# Patient Record
Sex: Male | Born: 1995 | Race: Black or African American | Hispanic: No | Marital: Single | Smoking: Current some day smoker
Health system: Southern US, Community
[De-identification: ages and names within clinical notes are randomized; demographics above are authoritative.]

## PROBLEM LIST (undated history)

## (undated) DIAGNOSIS — J302 Other seasonal allergic rhinitis: Secondary | ICD-10-CM

---

## 2013-03-20 ENCOUNTER — Encounter (HOSPITAL_COMMUNITY): Payer: Self-pay | Admitting: Nurse Practitioner

## 2013-03-20 ENCOUNTER — Emergency Department (HOSPITAL_COMMUNITY)
Admission: EM | Admit: 2013-03-20 | Discharge: 2013-03-20 | Disposition: A | Discharge status: Home / Self Care | Payer: Medicaid Other | Attending: Emergency Medicine | Admitting: Emergency Medicine

## 2013-03-20 DIAGNOSIS — F17200 Nicotine dependence, unspecified, uncomplicated: Secondary | ICD-10-CM | POA: Insufficient documentation

## 2013-03-20 DIAGNOSIS — N63 Unspecified lump in unspecified breast: Secondary | ICD-10-CM | POA: Insufficient documentation

## 2013-03-20 DIAGNOSIS — N644 Mastodynia: Secondary | ICD-10-CM

## 2013-03-20 NOTE — ED Provider Notes (Signed)
History     CSN: [id number]  Arrival date & time 03/20/13  1346   First MD Initiated Contact with Patient 03/20/13 1359      Chief Complaint  Patient presents with  . Breast Problem    (Consider location/radiation/quality/duration/timing/severity/associated sxs/prior treatment) HPI  Pt is a 17 yo M presenting for "knot" next to his left nipple that he first noted two to three weeks ago. Pain is sharp with palpation, achy at other times. Denies any trauma or injury to area. Denies any rashes, fevers, chills, drainage, warmth in the area. Patient denies fevers, chills, CP, SOB, nausea, vomiting, or diarrhea.    History reviewed. No pertinent past medical history.  History reviewed. No pertinent past surgical history.  History reviewed. No pertinent family history.  History  Substance Use Topics  . Smoking status: Current Some Day Smoker    Types: Cigarettes  . Smokeless tobacco: Not on file  . Alcohol Use: No      Review of Systems  Constitutional: Negative for fever and chills.  HENT: Negative for neck pain.   Eyes: Negative for visual disturbance.  Respiratory: Negative for shortness of breath.   Cardiovascular: Negative for chest pain.  Musculoskeletal: Negative for back pain.  Skin: Negative for color change, pallor, rash and wound.  Neurological: Negative for headaches.    Allergies  Review of patient's allergies indicates no known allergies.  Home Medications  No current outpatient prescriptions on file.  BP 130/68  Pulse 63  Temp(Src) 98.2 F (36.8 C) (Oral)  Resp 16  SpO2 99%  Physical Exam  Constitutional: He is oriented to person, place, and time. He appears well-developed and well-nourished. No distress.  HENT:  Head: Normocephalic and atraumatic.  Eyes: Conjunctivae are normal.  Neck: Neck supple.  Cardiovascular: Normal rate, regular rhythm and normal heart sounds.   Pulmonary/Chest: Effort normal and breath sounds normal.    Small  moveable cyst   Neurological: He is alert and oriented to person, place, and time.  Skin: Skin is warm and dry. He is not diaphoretic.  Psychiatric: He has a normal mood and affect.    ED Course  Procedures (including critical care time)  Labs Reviewed - No data to display No results found.   1. Breast pain in male       MDM  Pt is a 17 yo M presenting for left small freely moving cyst near his nipple. VSS. No erythema, warmth, or drainage noted. No signs of infection. Pt advised to call a PCP for follow up and management. Advised to use warm compresses and symptomatic care. Return precautions given. Patient agreeable to plan. Patient is stable at time of discharge         Jeannetta Ellis, PA-C 03/20/13 1522

## 2013-03-20 NOTE — ED Notes (Signed)
States it feels like theres a knot in his L nipple today, painful and swollen. States he had something similar as a child that resolved with no treatment.

## 2013-03-21 NOTE — ED Provider Notes (Signed)
Medical screening examination/treatment/procedure(s) were performed by non-physician practitioner and as supervising physician I was immediately available for consultation/collaboration.   Arlie Riker III, MD 03/21/13 1815 

## 2013-05-27 ENCOUNTER — Ambulatory Visit: Payer: Self-pay | Admitting: Pediatrics

## 2013-07-08 ENCOUNTER — Ambulatory Visit: Payer: Self-pay | Admitting: Pediatrics

## 2013-08-03 ENCOUNTER — Ambulatory Visit: Payer: Self-pay | Admitting: Pediatrics

## 2014-09-22 ENCOUNTER — Encounter (HOSPITAL_COMMUNITY): Payer: Self-pay | Admitting: *Deleted

## 2014-09-22 ENCOUNTER — Emergency Department (HOSPITAL_COMMUNITY)
Admission: EM | Admit: 2014-09-22 | Discharge: 2014-09-22 | Disposition: A | Discharge status: Home / Self Care | Payer: Medicaid Other | Attending: Emergency Medicine | Admitting: Emergency Medicine

## 2014-09-22 DIAGNOSIS — X58XXXA Exposure to other specified factors, initial encounter: Secondary | ICD-10-CM | POA: Insufficient documentation

## 2014-09-22 DIAGNOSIS — Y9289 Other specified places as the place of occurrence of the external cause: Secondary | ICD-10-CM | POA: Diagnosis not present

## 2014-09-22 DIAGNOSIS — Y998 Other external cause status: Secondary | ICD-10-CM | POA: Diagnosis not present

## 2014-09-22 DIAGNOSIS — Y9389 Activity, other specified: Secondary | ICD-10-CM | POA: Diagnosis not present

## 2014-09-22 DIAGNOSIS — N63 Unspecified lump in unspecified breast: Secondary | ICD-10-CM

## 2014-09-22 DIAGNOSIS — S6392XA Sprain of unspecified part of left wrist and hand, initial encounter: Secondary | ICD-10-CM | POA: Insufficient documentation

## 2014-09-22 DIAGNOSIS — N6002 Solitary cyst of left breast: Secondary | ICD-10-CM | POA: Diagnosis present

## 2014-09-22 DIAGNOSIS — Z720 Tobacco use: Secondary | ICD-10-CM | POA: Insufficient documentation

## 2014-09-22 NOTE — Discharge Instructions (Signed)
Please read and follow all provided instructions.  Your diagnoses today include:  1. Sprain of hand, left, initial encounter   2. Breast lump     Tests performed today include:  Vital signs. See below for your results today.   Medications prescribed:   None  Take any prescribed medications only as directed.  Home care instructions:   Follow any educational materials contained in this packet  Monitor the area on your chest and seek medical attention if you feel a lump that is becoming larger  Use ibuprofen as directed on the packaging for pain  Follow R.I.C.E. Protocol:  R - rest your injury   I  - use ice on injury without applying directly to skin  C - compress injury with bandage or splint  E - elevate the injury as much as possible  Follow-up instructions: Please follow-up with your primary care provider if you continue to have significant pain or trouble walking in 1 week. In this case you may have a severe injury that requires further care.   Return instructions:   Please return if your toes are numb or tingling, appear gray or blue, or you have severe pain (also elevate leg and loosen splint or wrap if you were given one)  Please return to the Emergency Department if you experience worsening symptoms.   Please return if you have any other emergent concerns.  Additional Information:  Your vital signs today were: BP 134/77 mmHg   Pulse 84   Temp(Src) 97.8 F (36.6 C) (Oral)   Resp 18   SpO2 100% If your blood pressure (BP) was elevated above 135/85 this visit, please have this repeated by your doctor within one month. --------------

## 2014-09-22 NOTE — ED Notes (Signed)
The pt is here to get the cyst he has had in his lt chest checked.  He has had for 2 months.  He wants to be checked to see if it is still there
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
## 2014-10-11 ENCOUNTER — Emergency Department (HOSPITAL_COMMUNITY)
Admission: EM | Admit: 2014-10-11 | Discharge: 2014-10-11 | Disposition: A | Discharge status: Home / Self Care | Payer: Medicaid Other | Attending: Emergency Medicine | Admitting: Emergency Medicine

## 2014-10-11 ENCOUNTER — Encounter (HOSPITAL_COMMUNITY): Payer: Self-pay | Admitting: Family Medicine

## 2014-10-11 DIAGNOSIS — Z720 Tobacco use: Secondary | ICD-10-CM | POA: Insufficient documentation

## 2014-10-11 DIAGNOSIS — R42 Dizziness and giddiness: Secondary | ICD-10-CM | POA: Diagnosis not present

## 2014-10-11 LAB — BASIC METABOLIC PANEL
Anion gap: 11 (ref 5–15)
BUN: 12 mg/dL (ref 6–23)
CALCIUM: 9.3 mg/dL (ref 8.4–10.5)
CO2: 28 mEq/L (ref 19–32)
Chloride: 102 mEq/L (ref 96–112)
Creatinine, Ser: 0.99 mg/dL (ref 0.50–1.35)
GFR calc Af Amer: >90 mL/min (ref >90)
GFR calc non Af Amer: >90 mL/min (ref >90)
GLUCOSE: 69 mg/dL — AB (ref 70–99)
Potassium: 4.3 mEq/L (ref 3.7–5.3)
Sodium: 141 mEq/L (ref 137–147)

## 2014-10-11 LAB — CBC
HEMATOCRIT: 44.7 % (ref 39.0–52.0)
HEMOGLOBIN: 15.3 g/dL (ref 13.0–17.0)
MCH: 32 pg (ref 26.0–34.0)
MCHC: 34.2 g/dL (ref 30.0–36.0)
MCV: 93.5 fL (ref 78.0–100.0)
Platelets: 165 10*3/uL (ref 150–400)
RBC: 4.78 MIL/uL (ref 4.22–5.81)
RDW: 12.4 % (ref 11.5–15.5)
WBC: 4.5 10*3/uL (ref 4.0–10.5)

## 2014-10-11 NOTE — ED Notes (Signed)
Per pt sts that he was sitting in [employer] and became [place]zy. [facility]

## 2014-10-11 NOTE — Discharge Instructions (Signed)

## 2014-10-11 NOTE — ED Notes (Signed)
Pt made aware to return if symptoms worsen or if any life threatening symptoms occur.   

## 2014-10-11 NOTE — ED Notes (Signed)
Pt reports dizziness and lighthead today with no vision changes.  Pt denies h/a, n/v/d.  Pt thought he was going to pass out.  Pt is currently feeling less dizzy.

## 2014-10-11 NOTE — ED Provider Notes (Signed)
CSN: [id number]     Arrival date & time 10/11/14  1158 History   First MD Initiated Contact with Patient 10/11/14 1200     Chief Complaint  Patient presents with  . Dizziness     (Consider location/radiation/quality/duration/timing/severity/associated sxs/prior Treatment) HPI Comments: Patient presents today with a chief complaint of dizziness.  He reports that around 11 AM today he began feeling dizzy while sitting at [employer].  He reports that the dizziness lasted a few minutes and then resolved.  He describes the dizziness as feeling lightheaded.  He denies any dizziness at this time.  He states that he had not eaten or drank anything at all today prior to the episode.  When the dizziness occurred he ate and then the dizziness resolved.  No history of DM.  He denies fever, chills, SOB, headache, chest pain, syncope, nausea, vomiting, diarrhea, or vision changes.  Patient is a 18 y.o. male presenting with dizziness. The history is provided by the patient.  Dizziness   History reviewed. No pertinent past medical history. History reviewed. No pertinent past surgical history. History reviewed. No pertinent family history. History  Substance Use Topics  . Smoking status: Current Some Day Smoker    Types: Cigarettes  . Smokeless tobacco: Not on file  . Alcohol Use: No    Review of Systems  Neurological: Positive for dizziness.  All other systems reviewed and are negative.     Allergies  Review of patient's allergies indicates no known allergies.  Home Medications   Prior to Admission medications   Not on File   BP 126/74 mmHg  Pulse 103  Temp(Src) 98 F (36.7 C) (Oral)  Resp 16  Ht 5\' 11"  (1.803 m)  Wt 140 lb (63.504 kg)  BMI 19.53 kg/m2  SpO2 97% Physical Exam  Constitutional: He appears well-developed and well-nourished.  HENT:  Head: Normocephalic and atraumatic.  Mouth/Throat: Oropharynx is clear and moist.  Eyes: EOM are normal. Pupils are equal, round,  and reactive to light.  Neck: Normal range of motion. Neck supple.  Cardiovascular: Normal rate, regular rhythm and normal heart sounds.   No murmur heard. Pulmonary/Chest: Effort normal and breath sounds normal.  Musculoskeletal: Normal range of motion.  Neurological: He is alert. He has normal strength. No cranial nerve deficit or sensory deficit. Coordination and gait normal.  Normal rapid alternating movements Normal finger to nose testing Normal gait, no ataxia  Skin: Skin is warm and dry.  Psychiatric: He has a normal mood and affect.  Nursing note and vitals reviewed.   ED Course  Procedures (including critical care time) Labs Review Labs Reviewed  CBC  BASIC METABOLIC PANEL    Imaging Review No results found.   EKG Interpretation   Date/Time:  Wednesday October 11 2014 15:30:37 EST Ventricular Rate:  66 PR Interval:  116 QRS Duration: 87 QT Interval:  385 QTC Calculation: 403 R Axis:   92 Text Interpretation:  Sinus rhythm Atrial premature complex Borderline  short PR interval Borderline right axis deviation Borderline Q waves in  lateral leads Confirmed by Lincoln Brighamees, Liz [phone]([id number]) on 10/11/2014 4:00:56 PM     4:14 PM Patient's blood sugar is 69.  Patient given sandwich and something to drink. 4:40 PM Reassessed patient.  Patient eating [employer] Crackers, Peanut Butter, and a Sandwich.  He has also drank a 12 oz Soda.  No dizziness at this time.  Feel that the patient is stable for discharge. MDM   Final diagnoses:  None  Patient presents with a chief complaint of dizziness.  He reports that this occurred earlier in the day today and resolved after a few minutes once he ate.  No dizziness while in the ED.  Normal neurological exam.  VSS.  Labs unremarkable.  Feel that the patient is stable for discharge.  Return precautions given.     Santiago GladHeather Sade Hollon, PA-C 10/13/14 [id number]  Tilden FossaElizabeth Rees, MD 10/16/14 [phone]

## 2014-10-16 ENCOUNTER — Encounter (HOSPITAL_COMMUNITY): Payer: Self-pay | Admitting: Family Medicine

## 2014-10-16 ENCOUNTER — Emergency Department (HOSPITAL_COMMUNITY)
Admission: EM | Admit: 2014-10-16 | Discharge: 2014-10-16 | Discharge status: Against Medical Advice | Payer: Medicaid Other | Attending: Emergency Medicine | Admitting: Emergency Medicine

## 2014-10-16 DIAGNOSIS — Z0000 Encounter for general adult medical examination without abnormal findings: Secondary | ICD-10-CM | POA: Insufficient documentation

## 2014-10-16 DIAGNOSIS — Z720 Tobacco use: Secondary | ICD-10-CM | POA: Insufficient documentation

## 2014-10-16 NOTE — ED Notes (Signed)
Pt states he "felt dizzy for a little while" this am. Denies N/V, denies pain. No dizziness at present.

## 2014-10-16 NOTE — ED Notes (Signed)
Pt asking for bus pass.

## 2014-10-16 NOTE — ED Notes (Signed)
Pt sts he is here for BP check. Denies any other complaints, symptoms or problems.

## 2014-10-17 ENCOUNTER — Emergency Department (HOSPITAL_COMMUNITY)
Admission: EM | Admit: 2014-10-17 | Discharge: 2014-10-17 | Disposition: A | Discharge status: Home / Self Care | Payer: Medicaid Other | Attending: Emergency Medicine | Admitting: Emergency Medicine

## 2014-10-17 ENCOUNTER — Encounter (HOSPITAL_COMMUNITY): Payer: Self-pay | Admitting: *Deleted

## 2014-10-17 DIAGNOSIS — Z720 Tobacco use: Secondary | ICD-10-CM | POA: Insufficient documentation

## 2014-10-17 DIAGNOSIS — L989 Disorder of the skin and subcutaneous tissue, unspecified: Secondary | ICD-10-CM | POA: Diagnosis present

## 2014-10-17 NOTE — ED Notes (Signed)
EDP at bedside  

## 2014-10-17 NOTE — ED Notes (Signed)
Called twice in main waiting and subwaiting with no answer

## 2014-10-17 NOTE — ED Notes (Signed)
Pt reports a bump on his head that he noticed three weeks ago after he got his perm. Pt states it itches and keep picking at the bump. Pt want to know what the bump is.

## 2014-10-17 NOTE — ED Provider Notes (Signed)
CSN: [id number]     Arrival date & time 10/17/14  2023 History   First MD Initiated Contact with Patient 10/17/14 2218     Chief Complaint  Patient presents with  . Abrasion     (Consider location/radiation/quality/duration/timing/severity/associated sxs/prior Treatment) The history is provided by the patient. No language interpreter was used.  Raymond Moore is a 18 year old male with no known significant past medical history presenting to the ED with a lesion to the right hairline aspect that is been ongoing for approximately 2 months. Patient reported that when he got his hair permanent, stated that he had some discomfort when brushing his hair back and stated that a lesion develop shortly after. Stated that there was no pain to the lesion, stated that it continues to itch. Stated that the size lesion is section decreased with time but continues to have itching. Stated that when he scratches the lesion for long periods time there is intermittent bleeding. Stated that he has been using warm water and soap. Denied pus drainage, cirrhotic bleeding, burning, fever, chills, numbness, tingling, headache, dizziness, chest pain, shortness of breath, difficulty breathing, throat closing sensation, tongue swelling. Denied new products of soap, shampoo, creams. PCP none  History reviewed. No pertinent past medical history. History reviewed. No pertinent past surgical history. History reviewed. No pertinent family history. History  Substance Use Topics  . Smoking status: Current Some Day Smoker    Types: Cigarettes  . Smokeless tobacco: Not on file  . Alcohol Use: No    Review of Systems  Constitutional: Negative for fever and chills.  Eyes: Negative for visual disturbance.  Respiratory: Negative for chest tightness and shortness of breath.   Cardiovascular: Negative for chest pain.  Skin: Positive for color change. Negative for wound.  Neurological: Negative for dizziness, weakness, numbness  and headaches.      Allergies  Review of patient's allergies indicates no known allergies.  Home Medications   Prior to Admission medications   Not on File   BP 99/54 mmHg  Pulse 65  Temp(Src) 98 F (36.7 C) (Oral)  Resp 16  Ht 5\' 11"  (1.803 m)  Wt 140 lb (63.504 kg)  BMI 19.53 kg/m2  SpO2 100% Physical Exam  Constitutional: He is oriented to person, place, and time. He appears well-developed and well-nourished. No distress.  HENT:  Head: Normocephalic and atraumatic.    Mouth/Throat: Oropharynx is clear and moist. No oropharyngeal exudate.  Eyes: Conjunctivae and EOM are normal. Pupils are equal, round, and reactive to light. Right eye exhibits no discharge. Left eye exhibits no discharge.  Neck: Normal range of motion. Neck supple. No tracheal deviation present.  Negative neck stiffness Negative nuchal rigidity  Negative cervical lymphadenopathy  Negative meningeal signs   Cardiovascular: Normal rate, regular rhythm and normal heart sounds.  Exam reveals no friction rub.   No murmur heard. Pulses:      Radial pulses are 2+ on the right side, and 2+ on the left side.  Pulmonary/Chest: Effort normal and breath sounds normal. No respiratory distress. He has no wheezes. He has no rales.  Musculoskeletal: Normal range of motion.  Lymphadenopathy:    He has no cervical adenopathy.  Neurological: He is alert and oriented to person, place, and time. No cranial nerve deficit. He exhibits normal muscle tone. Coordination normal.  Skin: Skin is warm and dry. No rash noted. He is not diaphoretic. No erythema.  Raised lesion identified to the right aspect of the forehead, near hairline - measures  approximately 1 mm. Irregular surface and roughened upon palpation. Negative active bleeding or drainage. Negative pain upon palpation. Negative surrounding erythema, inflammation, lesions, sores, swelling. Has a keratosis presentation of the skin.   Psychiatric: He has a normal mood and  affect. His behavior is normal. Thought content normal.  Nursing note and vitals reviewed.   ED Course  Procedures (including critical care time) Labs Review Labs Reviewed - No data to display  Imaging Review No results found.   EKG Interpretation None      MDM   Final diagnoses:  Skin lesion    Medications - No data to display  Filed Vitals:   10/17/14 2031 10/17/14 2331  BP: 119/63 99/54  Pulse: 72 65  Temp: 98 F (36.7 C)   TempSrc: Oral   Resp: 16 16  Height: 5\' 11"  (1.803 m)   Weight: 140 lb (63.504 kg)   SpO2: 100% 100%   Patient presenting to the ED with a lesion to the right aspect of the hairline has been ongoing for approximately 2 months after he had a perm performed. Stated that he believes the chemicals from the perm caused this lesion to occur. Stated that it is pruritic-denied increased size, swelling, redness, drainage. Stated he's been putting warm water and soap. Denied any new creams or lotions her face soaps applied. Keratotic presentation of the skin lesion measuring approximately 1 mm in size without surrounding cellulitis or infection. Negative pain upon palpation. Negative active drainage or bleeding. Definitive etiology of skin lesion unknown. No new symptoms as per patient. Patient stable, afebrile. Patient septic appearing. Discharged patient. Referred patient to dermatologist for lesion to be shaved or excised with further testing. Patient understood and agreed. Discussed with patient to closely monitor symptoms and if symptoms are to worsen or change to report back to the ED - strict return instructions given.  Patient agreed to plan of care, understood, all questions answered.   Raymon MuttonMarissa Josten [place], PA-C 10/18/14 0009  Elwin MochaBlair Walden, MD 10/18/14 [phone]

## 2014-10-17 NOTE — Discharge Instructions (Signed)
Please call your doctor for a followup appointment within 24-48 hours. When you talk to your doctor please let them know that you were seen in the emergency department and have them acquire all of your records so that they can discuss the findings with you and formulate a treatment plan to fully care for your new and ongoing problems. Please call and follow up with Dermatologist Please avoid any new lotions, creams, face soaps Please continue to monitor symptoms closely and if symptoms are to worsen or change (fever greater than 101, chills, sweating, nausea, vomiting, chest pain, shortness of breathe, difficulty breathing, weakness, numbness, tingling, worsening or changes to pain pattern, swelling, redness, pus drainage, random bleeding, headache, dizziness, changes to size of the lesion) please report back to the Emergency Department immediately.     Emergency Department Resource Guide 1) Find a Doctor and Pay Out of Pocket Although you won't have to find out who is covered by your insurance plan, it is a good idea to ask around and get recommendations. You will then need to call the office and see if the doctor you have chosen will accept you as a new patient and what types of options they offer for patients who are self-pay. Some doctors offer discounts or will set up payment plans for their patients who do not have insurance, but you will need to ask so you aren't surprised when you get to your appointment.  2) Contact Your Local Health Department Not all health departments have doctors that can see patients for sick visits, but many do, so it is worth a call to see if yours does. If you don't know where your local health department is, you can check in your phone book. The CDC also has a tool to help you locate your state's health department, and many state websites also have listings of all of their local health departments.  3) Find a Walk-in Clinic If your illness is not likely to be very  severe or complicated, you may want to try a walk in clinic. These are popping up all over the country in pharmacies, drugstores, and shopping centers. They're usually staffed by nurse practitioners or physician assistants that have been trained to treat common illnesses and complaints. They're usually fairly quick and inexpensive. However, if you have serious medical issues or chronic medical problems, these are probably not your best option.  No Primary Care Doctor: - Call Health Connect at  [phone] - they can help you locate a primary care doctor that  accepts your insurance, provides certain services, etc. - Physician Referral Service- [phone]  Chronic Pain Problems: Organization         Address  Phone   Notes  [facility]  [phone]) [phone] Patients need to be referred by their primary care doctor.   Medication Assistance: Organization         Address  Phone   Notes  [facility] [address] [place], [place] [address] [phone] --Must be a resident of [place] -- Must have NO insurance coverage whatsoever (no Medicaid/ Medicare, etc.) -- The pt. MUST have a primary care doctor that directs their care regularly and follows them in the community   MedAssist  [phone]) [phone]   [employer]  [phone]    Agencies that provide inexpensive medical care: Organization         Address  Phone   Notes  [facility]  (  [phone]   [facility]    [phone]   [facility] [address] [place], [place] [address] [phone]   [facility] [address] [place] [phone]   Planned Parenthood    [phone]   [facility]    [phone]   [facility] and [facility]  [address], [place] Phone:  [phone], Fax:  [phone] Hours of Operation:  9 am - 6 pm, M-F.  Also accepts  Medicaid/Medicare and self-pay.  [facility]  [address], [place] Phone: [phone], Fax: [phone]. Hours of Operation:  8:30 am - 5:30 pm, M-F.  Also accepts Medicaid and self-pay.  [facility] [place] [address] [place] Phone: [phone]   [facility] [address] Natasha Bence [place], [place] [phone], Ext. [phone] Mondays & Thursdays: 7-9 AM.  First 15 patients are seen on a first come, first serve basis.    Medicaid-accepting [facility] Providers:  Organization         Address  Phone   Notes  [facility] [address], [place] [phone] Also accepts self-pay patients.  [facility] [address] Laurell Josephs [place], [place]  [phone]   [facility] [address], [place] [phone]   [facility] [address], [place] [phone]   Renaye Rakers [address], [place]   [phone] Only accepts [place] patients after they have their name applied to their card.   Self-Pay (no insurance) in [facility]:  Organization         Address  Phone   Notes  Sickle Cell Patients, [facility] [address] [place], [place] [phone]   [facility] [address] [place], [place] [phone]   [facility]  [address], [place] [phone]   [facility]/Dr. Osei-Bonsu  [address], [place] or [address], [place] [phone] Phone number for both [place] and [place] locations is the same.  [facility] [address], [place] [phone]   [facility] [address], [place] or [address] [phone]   [facility] [address], [place] [phone], phone; [phone], fax Sees patients 1st and 3rd Saturday of every month.  Must not qualify for public or private insurance (i.e. Medicaid, Medicare, [place] Health Choice, Veterans' Benefits)  Household income should be no more than 200% of the poverty level The clinic cannot treat you if you are pregnant or think you are pregnant  Sexually transmitted diseases are not treated at the clinic.    Dental Care: Organization         Address  Phone  Notes  [facility] [address] [place], [place] [phone] Accepts children up to age 66 who are enrolled in [place] or [place] Health Choice; pregnant women with a Medicaid card; and children who have applied for Medicaid or [place] Health Choice, but were declined, whose parents can pay a reduced fee at time of service.  [facility] of [employer]  [address], [place] [phone] Accepts children up to age 53 who are enrolled in Medicaid or [place] Health Choice; pregnant women with a Medicaid card; and children who have applied for Medicaid or [place] Health Choice, but were declined, whose parents can pay a reduced fee at time of service.  [facility]  [address] [place], [place] [phone] Patients are seen by appointment only. Walk-ins are not accepted. [facility] will see patients 14 years of age and older. Monday - Tuesday (8am-5pm) Most Wednesdays (8:30-5pm) $30 per visit, cash only  [facility] Adult Dental Access PROGRAM  [address], [facility] [phone] Patients are seen by appointment only. Walk-ins are not accepted. [facility] will see patients 46 years of age and older. One Wednesday Evening (Monthly: Volunteer Based).  $30 per visit, cash only  [employer]  [phone] for adults; Children under age 26, call Graduate Pediatric Dentistry at [phone]. Children aged  39-14, please call [phone] to request a pediatric application.  Dental services are provided in all areas of dental care including fillings, crowns and bridges, complete and partial dentures, implants, gum treatment, root canals, and extractions. Preventive care is also provided. Treatment is provided to both adults and children. Patients are selected via a lottery and there is often a waiting list.   [facility] [address], [place]  [phone] [email or web address]   [facility] [address] [place], [place] [phone], Ext. [phone] Second and Fourth Thursday of each month, opens at 6:30 AM; Clinic ends at 9 AM.  Patients are seen on a first-come first-served basis, and a limited number are seen during each clinic.   [facility]  [address] Ether Griffins [place], [place] [phone]   Eligibility Requirements You must have lived in [place], [place], or [place] counties for at least the last three months.   You cannot be eligible for state or federal sponsored [place], including [employer], [place], or [employer].   You generally cannot be eligible for healthcare insurance through your employer.    How to apply: Eligibility screenings are held every Tuesday and Wednesday afternoon from 1:00 pm until 4:00 pm. You do not need an appointment for the interview!  [facility] [address] [place], [place] [phone]   [facility] Health Department  [phone]   [facility]  [phone]   [facility] Health Department  [phone]    Behavioral Health Resources in the Community: Intensive Outpatient Programs Organization         Address  Phone  Notes  [facility] [address] [place], [place] [phone]   [facility] [address], [place], [place] [phone]   [facility] [address],  [place], [place]  [phone]   [facility] [address]  [place], [place] [phone] or [phone]   Substance Abuse Resources Organization         Address  Phone  Notes  [facility] Drug Services  [phone]   [facility]  [phone]   [facility]  [phone]   Floydene Flock  [phone]   Residential & Outpatient Substance Abuse Program  [phone]   Psychological Services Organization         Address  Phone  Notes  [facility]  [phone]  [employer]  [phone]- [phone]   [facility] [address], [place] [phone] or [phone]    Mobile Crisis Teams Organization         Address  Phone  Notes  [facility], Mobile Crisis Care Unit  [phone]   [facility]  [address] [place], [place] [phone]   Doristine LocksSharon DeEsch [address] [place] [phone]    Self-Help/Support Groups Organization         Address  Phone             Notes  [facility]oc[facility] of [place] - variety of support groups  336- [phone] Call for more information  Narcotics Anonymous (NA), [facility] [address], [place]  2 meetings at this location   Statisticianesidential Treatment Programs Organization         Address  Phone  Notes  [facility] [address],    [place]  [phone]   [facility]  [address], [place] [phone], [place], [place] [phone]   [facility] [address], [place] [phone] Admissions: 8am-3pm M-F  [facility] [address]    [place], [place] [phone]   [facility] [address], [place], [place] [phone]   [facility] [address]  [place], [place] [phone]   [facility] - Intensive Outpatient [address], [place], [place] [phone]   [facility]  ([facility] [address]  [place], [place] [phone] or [phone]   [facility] ([facility]) [address] [place], [place] [phone] Accepts Medicaid  [facility] [address]  [place] [phone] Substance Abuse/Addiction Treatment   [facility] Behavioral Health Resources Organization         Address  Phone  Notes  [facility]  [phone]) [phone]   Angie FavaJulie Brannon, PhD [address] [place], [place]   [phone](336) [phone] or [phone]   [facility]   [address] [place], [place] [phone](336) [phone]   [facility] [address], [place], [place] [phone](336) [phone] Insurance/Medicaid/sponsorship through [facility]  [facility] [address]                                    [place], [place] [phone]) [phone] Therapy/tele-psych/case  [facility] [address]   [place], [place] [phone](336) [phone]    Dr. Lolly MustacheArfeen  [phone](336) [phone]   [facility]  United [employer] [facility] 1) [address], [place] 2) [address], [place] 3)  [address], [place] [phone](336) [phone]  [phone]) [phone]   [facility] Child Abuse Hotline [phone](336) [phone] or [phone]) [phone] (After Hours)

## 2014-11-03 ENCOUNTER — Encounter (HOSPITAL_COMMUNITY): Payer: Self-pay | Admitting: Emergency Medicine

## 2014-11-03 ENCOUNTER — Emergency Department (HOSPITAL_COMMUNITY)
Admission: EM | Admit: 2014-11-03 | Discharge: 2014-11-03 | Disposition: A | Discharge status: Home / Self Care | Payer: Medicaid Other | Attending: Emergency Medicine | Admitting: Emergency Medicine

## 2014-11-03 ENCOUNTER — Emergency Department (HOSPITAL_COMMUNITY): Payer: Medicaid Other

## 2014-11-03 DIAGNOSIS — Z720 Tobacco use: Secondary | ICD-10-CM | POA: Diagnosis not present

## 2014-11-03 DIAGNOSIS — R059 Cough, unspecified: Secondary | ICD-10-CM

## 2014-11-03 DIAGNOSIS — J069 Acute upper respiratory infection, unspecified: Secondary | ICD-10-CM

## 2014-11-03 DIAGNOSIS — R05 Cough: Secondary | ICD-10-CM

## 2014-11-03 HISTORY — DX: Other seasonal allergic rhinitis: J30.2

## 2014-11-03 NOTE — ED Provider Notes (Signed)
CSN: [id number]     Arrival date & time 11/03/14  0940 History   First MD Initiated Contact with Patient 11/03/14 [phone]     Chief Complaint  Patient presents with  . Cough  . Nasal Congestion     (Consider location/radiation/quality/duration/timing/severity/associated sxs/prior Treatment) HPI  Raymond Moore is a 19 y.o. male  presenting with 3 days of low-grade fevers, cough, URI symptoms with thick yellow sputum. He did note some bright red blood streaks in his sputum today that resolved and have not been recurrent. He denies any chest pain or difficulty breathing. Has not had a cardiac history. He denies taking anything for his symptoms. She denies alleviating or aggravating factors. Patient with sick contacts. Patient is a current smoker.   Past Medical History  Diagnosis Date  . Seasonal allergies    History reviewed. No pertinent past surgical history. No family history on file. History  Substance Use Topics  . Smoking status: Current Some Day Smoker    Types: Cigarettes  . Smokeless tobacco: Not on file  . Alcohol Use: No    Review of Systems  Constitutional: Positive for fever and chills.  HENT: Positive for congestion, rhinorrhea and sinus pressure. Negative for ear pain and sore throat.   Respiratory: Positive for cough. Negative for shortness of breath.   Cardiovascular: Negative for chest pain and palpitations.  Gastrointestinal: Negative for nausea, vomiting, abdominal pain and diarrhea.  Musculoskeletal: Negative for back pain.  Skin: Negative for rash.  Neurological: Negative for dizziness and numbness.      Allergies  Review of patient's allergies indicates no known allergies.  Home Medications   Prior to Admission medications   Not on File   BP 120/66 mmHg  Pulse 60  Temp(Src) 98.4 F (36.9 C) (Oral)  Resp 16  SpO2 100% Physical Exam  Constitutional: He appears well-developed and well-nourished. No distress.  HENT:  Head: Normocephalic and  atraumatic.  Nose: Right sinus exhibits no maxillary sinus tenderness and no frontal sinus tenderness. Left sinus exhibits no maxillary sinus tenderness and no frontal sinus tenderness.  Mouth/Throat: Mucous membranes are normal. Posterior oropharyngeal edema and posterior oropharyngeal erythema present. No oropharyngeal exudate.  Eyes: Conjunctivae and EOM are normal. Right eye exhibits no discharge. Left eye exhibits no discharge.  Neck: Normal range of motion. Neck supple.  Cardiovascular: Normal rate, regular rhythm and normal heart sounds.   Pulmonary/Chest: Effort normal and breath sounds normal. No respiratory distress. He has no wheezes. He has no rales.  Abdominal: Soft. Bowel sounds are normal. He exhibits no distension. There is no tenderness.  Lymphadenopathy:    He has cervical adenopathy.  Neurological: He is alert.  Skin: Skin is warm and dry. He is not diaphoretic.  Nursing note and vitals reviewed.   ED Course  Procedures (including critical care time) Labs Review Labs Reviewed - No data to display  Imaging Review Dg Chest 2 View  11/03/2014   CLINICAL DATA:  Cough and congestion, history of tobacco use  EXAM: CHEST  2 VIEW  COMPARISON:  None.  FINDINGS: The heart size and mediastinal contours are within normal limits. Both lungs are clear. The visualized skeletal structures are unremarkable.  IMPRESSION: No active cardiopulmonary disease.   Electronically Signed   By: Alcide Clever M.D.   On: 11/03/2014 11:00     EKG Interpretation None      MDM   Final diagnoses:  Cough  URI (upper respiratory infection)   Pt CXR negative for acute  infiltrate. Patient with small amount of blood in sputum that resolved. Likely due to his persistent coughing. Patient advised to return if blood in his sputum is persistent and does not resolve. Patients symptoms are consistent with URI, likely viral etiology. Discussed that antibiotics are not indicated for viral infections. Pt will  be discharged with symptomatic treatment.  Verbalizes understanding and is agreeable with plan. Pt is hemodynamically stable & in NAD prior to dc. Patient to take Sudafed for 5 days for his nasal congestion.  Discussed return precautions with patient. Discussed all results and patient verbalizes understanding and agrees with plan.     Louann SjogrenVictoria L Saliah Crisp, PA-C 11/03/14 1112  Mirian MoMatthew Gentry, MD 11/04/14 [phone]

## 2014-11-03 NOTE — ED Notes (Signed)
Bed: WA23 Expected date:  Expected time:  Means of arrival:  Comments: EMS-cough 

## 2014-11-03 NOTE — ED Notes (Signed)
Pt from home via EMS c/o productive cough with yellow sputum and nasal congestion x3 days. Pt sts that he has some bright red blood when coughing this am. Pt is A&O and in NAD

## 2014-11-03 NOTE — Discharge Instructions (Signed)
Return to the emergency room with worsening of symptoms, new symptoms or with symptoms that are concerning , especially fevers, stiff neck, worsening headache, nausea/vomiting, visual changes or slurred speech, chest pain, shortness of breath, cough with thick colored mucous or blood Drink plenty of fluids with electrolytes especially Gatorade. OTC cold medications such as mucinex, nyquil, dayquil are recommended. Chloraseptic for sore throat. Seek to the pharmacist to obtain Sudafed take for 5 days.    Cough, Adult  A cough is a reflex that helps clear your throat and airways. It can help heal the body or may be a reaction to an irritated airway. A cough may only last 2 or 3 weeks (acute) or may last more than 8 weeks (chronic).  CAUSES Acute cough:  Viral or bacterial infections. Chronic cough:  Infections.  Allergies.  Asthma.  Post-nasal drip.  Smoking.  Heartburn or acid reflux.  Some medicines.  Chronic lung problems (COPD).  Cancer. SYMPTOMS   Cough.  Fever.  Chest pain.  Increased breathing rate.  High-pitched whistling sound when breathing (wheezing).  Colored mucus that you cough up (sputum). TREATMENT   A bacterial cough may be treated with antibiotic medicine.  A viral cough must run its course and will not respond to antibiotics.  Your caregiver may recommend other treatments if you have a chronic cough. HOME CARE INSTRUCTIONS   Only take over-the-counter or prescription medicines for pain, discomfort, or fever as directed by your caregiver. Use cough suppressants only as directed by your caregiver.  Use a cold steam vaporizer or humidifier in your bedroom or home to help loosen secretions.  Sleep in a semi-upright position if your cough is worse at night.  Rest as needed.  Stop smoking if you smoke. SEEK IMMEDIATE MEDICAL CARE IF:   You have pus in your sputum.  Your cough starts to worsen.  You cannot control your cough with  suppressants and are losing sleep.  You begin coughing up blood.  You have difficulty breathing.  You develop pain which is getting worse or is uncontrolled with medicine.  You have a fever. MAKE SURE YOU:   Understand these instructions.  Will watch your condition.  Will get help right away if you are not doing well or get worse. Document Released: 04/11/2011 Document Revised: 01/05/2012 Document Reviewed: 04/11/2011 [facility] Patient Information 2015 [place], [place]. This information is not intended to replace advice given to you by your health care provider. Make sure you discuss any questions you have with your health care provider.  Phenylephrine oral tablet What is this medicine? PHENYLEPHRINE (fen il EF rin) is a decongestant. It is used to relieve a stuffy nose from allergies, colds, or sinus problems. This medicine may be used for other purposes; ask your health care provider or pharmacist if you have questions. COMMON BRAND NAME(S): Sudafed PE, Sudafed PE Congestion, Sudogest PE What should I tell my health care provider before I take this medicine? They need to know if you have any of the following conditions: -diabetes -heart disease -high blood pressure -prostate problems -taken an MAOI like Carbex, Eldepryl, Marplan, Nardil, or Parnate in last 14 days -thyroid disease -trouble passing urine or change in the amount of urine -an unusual or allergic reaction to phenylephrine, other decongestants, other medicines, foods, dyes, or preservatives -pregnant or trying to get pregnant -breast-feeding How should I use this medicine? Take this medicine by mouth with a glass of water. Follow the directions on the prescription label. Take this medicine with  food, water, or milk to prevent stomach upset. Take your medicine at regular intervals. Do not take it more often than directed. Talk to your pediatrician regarding the use of this medicine in children. While this drug may be  prescribed for children as young as 85 years old for selected conditions, precautions do apply. Patients over 53 years old may have a stronger reaction and need a smaller dose. Overdosage: If you think you have taken too much of this medicine contact a poison control center or emergency room at once. NOTE: This medicine is only for you. Do not share this medicine with others. What if I miss a dose? If you miss a dose, take it as soon as you can. If it is almost time for your next dose, take only that dose. Do not take double or extra doses. What may interact with this medicine? Do not take this medicine with any of the following medications: -bromocriptine -cocaine -dihydroergotamine, ergotamine, ergoloid mesylates, methysergide, or ergot-type medication -MAOIs like Carbex, Eldepryl, Marplan, Nardil, and Parnate -other stimulant medicines This medicine may also interact with the following medications: -anesthesia -medicines for blood pressure -medicines for mental depression This list may not describe all possible interactions. Give your health care provider a list of all the medicines, herbs, non-prescription drugs, or dietary supplements you use. Also tell them if you smoke, drink alcohol, or use illegal drugs. Some items may interact with your medicine. What should I watch for while using this medicine? Tell your doctor or healthcare professional if your symptoms do not start to get better or if they get worse. See your doctor if you are not better in 7 days or if you have a fever. What side effects may I notice from receiving this medicine? Side effects that you should report to your doctor or health care professional as soon as possible: -allergic reactions like skin rash, itching or hives, swelling of the face, lips, or tongue -breathing problems -chest pain -fast, irregular heartbeat -feeling faint or lightheaded, falls -pain, tingling, numbness in the hands or feet -unusually weak  or tired Side effects that usually do not require medical attention (report to your doctor or health care professional if they continue or are bothersome): -difficulty sleeping -headache -loss of appetite -nervousness -stomach upset, nausea This list may not describe all possible side effects. Call your doctor for medical advice about side effects. You may report side effects to FDA at 1-800-FDA-1088. Where should I keep my medicine? Keep out of the reach of children. Store at room temperature between 15 and 30 degrees C (59 and 86 degrees F). Protect from light. Throw away any unused medicine after the expiration date. NOTE: This sheet is a summary. It may not cover all possible information. If you have questions about this medicine, talk to your doctor, pharmacist, or health care provider.  2015, Elsevier/Gold Standard. (2008-05-19 13:56:19)

## 2014-11-03 NOTE — ED Notes (Signed)
Pt from home via EMS-Per EMS, pt c/o productive cough with bright red tinge this am. Pt adds that he has nasal congestion. Pt has taken no OTC meds for relief. Pt is ambulatory, A&O and in NAD

## 2014-11-11 ENCOUNTER — Emergency Department (HOSPITAL_COMMUNITY)
Admission: EM | Admit: 2014-11-11 | Discharge: 2014-11-11 | Disposition: A | Discharge status: Home / Self Care | Payer: Medicaid Other | Attending: Emergency Medicine | Admitting: Emergency Medicine

## 2014-11-11 ENCOUNTER — Encounter (HOSPITAL_COMMUNITY): Payer: Self-pay | Admitting: Cardiology

## 2014-11-11 DIAGNOSIS — K088 Other specified disorders of teeth and supporting structures: Secondary | ICD-10-CM | POA: Diagnosis present

## 2014-11-11 DIAGNOSIS — K0381 Cracked tooth: Secondary | ICD-10-CM | POA: Diagnosis not present

## 2014-11-11 DIAGNOSIS — Z720 Tobacco use: Secondary | ICD-10-CM | POA: Insufficient documentation

## 2014-11-11 DIAGNOSIS — K0889 Other specified disorders of teeth and supporting structures: Secondary | ICD-10-CM

## 2014-11-11 DIAGNOSIS — S025XXA Fracture of tooth (traumatic), initial encounter for closed fracture: Secondary | ICD-10-CM

## 2014-11-11 MED ORDER — PENICILLIN V POTASSIUM 500 MG PO TABS: 500.0000 mg | ORAL_TABLET | Freq: Four times a day (QID) | ORAL | Status: AC

## 2014-11-11 MED ORDER — HYDROCODONE-ACETAMINOPHEN 5-325 MG PO TABS
2.0000 | ORAL_TABLET | Freq: Once | ORAL | Status: AC
  Administered 2014-11-11: 2 via ORAL
  Filled 2014-11-11: qty 2

## 2014-11-11 MED ORDER — HYDROCODONE-ACETAMINOPHEN 5-325 MG PO TABS
1.0000 | ORAL_TABLET | Freq: Four times a day (QID) | ORAL | Status: AC | PRN
Start: 2014-11-11 — End: ?

## 2014-11-11 NOTE — Discharge Instructions (Signed)
Dental Pain °A tooth ache may be caused by cavities (tooth decay). Cavities expose the nerve of the tooth to air and hot or cold temperatures. It may come from an infection or abscess (also called a boil or furuncle) around your tooth. It is also often caused by dental caries (tooth decay). This causes the pain you are having. °DIAGNOSIS  °Your caregiver can diagnose this problem by exam. °TREATMENT  °· If caused by an infection, it may be treated with medications which kill germs (antibiotics) and pain medications as prescribed by your caregiver. Take medications as directed. °· Only take over-the-counter or prescription medicines for pain, discomfort, or fever as directed by your caregiver. °· Whether the tooth ache today is caused by infection or dental disease, you should see your dentist as soon as possible for further care. °SEEK MEDICAL CARE IF: °The exam and treatment you received today has been provided on an emergency basis only. This is not a substitute for complete medical or dental care. If your problem worsens or new problems (symptoms) appear, and you are unable to meet with your dentist, call or return to this location. °SEEK IMMEDIATE MEDICAL CARE IF:  °· You have a fever. °· You develop redness and swelling of your face, jaw, or neck. °· You are unable to open your mouth. °· You have severe pain uncontrolled by pain medicine. °MAKE SURE YOU:  °· Understand these instructions. °· Will watch your condition. °· Will get help right away if you are not doing well or get worse. °Document Released: 10/13/2005 Document Revised: 01/05/2012 Document Reviewed: 05/31/2008 °ExitCare® Patient Information ©2015 ExitCare, LLC. This information is not intended to replace advice given to you by your health care provider. Make sure you discuss any questions you have with your health care provider. ° °Emergency Department Resource Guide °1) Find a Doctor and Pay Out of Pocket °Although you won't have to find out who  is covered by your insurance plan, it is a good idea to ask around and get recommendations. You will then need to call the office and see if the doctor you have chosen will accept you as a new patient and what types of options they offer for patients who are self-pay. Some doctors offer discounts or will set up payment plans for their patients who do not have insurance, but you will need to ask so you aren't surprised when you get to your appointment. ° °2) Contact Your Local Health Department °Not all health departments have doctors that can see patients for sick visits, but many do, so it is worth a call to see if yours does. If you don't know where your local health department is, you can check in your phone book. The CDC also has a tool to help you locate your state's health department, and many state websites also have listings of all of their local health departments. ° °3) Find a Walk-in Clinic °If your illness is not likely to be very severe or complicated, you may want to try a walk in clinic. These are popping up all over the country in pharmacies, drugstores, and shopping centers. They're usually staffed by nurse practitioners or physician assistants that have been trained to treat common illnesses and complaints. They're usually fairly quick and inexpensive. However, if you have serious medical issues or chronic medical problems, these are probably not your best option. ° °No Primary Care Doctor: °- Call [facility] at  832-8000 - they can help you locate a primary   care doctor that  accepts your insurance, provides certain services, etc. °- Physician Referral Service- 1-800-533-3463 ° °Chronic Pain Problems: °Organization         Address  Phone   Notes  °[facility]  ([phone]) 297-2271 Patients need to be referred by their primary care doctor.  ° °Medication Assistance: °Organization         Address  Phone   Notes  °[place] [facility] [address]  [address] °[place], [place] [address] (336) 641-8030 --Must be a resident of [place] °-- Must have NO insurance coverage whatsoever (no Medicaid/ Medicare, etc.) °-- The pt. MUST have a primary care doctor that directs their care regularly and follows them in the community °  °[facility]  (866) 331-1348   °[employer]  (888) 892-1162   ° °Agencies that provide inexpensive medical care: °Organization         Address  Phone   Notes  °[facility]  (336) 832-8035   °[facility]    (336) 832-7272   °[facility] [address] °[place], [place] [address] (336) 832-4777   °[facility] [address], °[place] (336) 271-4999   °[facility]    (336) 373-0678   °[facility]    (336) 272-1050   °[facility] ° [address], [place] Phone:  (336) 832-4444, Fax:  (336) 832-4440 Hours of Operation:  9 am - 6 pm, M-F.  Also accepts Medicaid/Medicare and self-pay.  °[facility] ° [address], [place] Phone: (336) [phone], Fax: (336) [phone]. Hours of Operation:  8:30 am - 5:30 pm, M-F.  Also accepts Medicaid and self-pay.  °[facility] [place] [address], [place] Phone: (336) 878-6027   °[facility] [address], [place], [place] (336)723-1848, Ext. 123 Mondays & Thursdays: 7-9 AM.  First 15 patients are seen on a first come, first serve basis. °  ° °Medicaid-accepting [place] Providers: ° °Organization         Address  Phone   Notes  °[facility] [address], [place] (336) 641-2100 Also accepts self-pay patients.  °[facility] [address], [place] ° (336) 856-9996   °[facility] [address], [place] (336) 288-8857   °[facility] [address] [place] [address], [place] (336) 299-7000   °Veita Bland [address], [place]  ° (336) 373-1557 Only accepts [place] Access Medicaid patients after they have their name applied to their card.  ° °Self-Pay (no insurance) in [place]: ° °Organization         Address  Phone   Notes  °Sickle Cell Patients, [facility] [address], [place] (336) 832-1970   °[facility] [address], [place] (336) 832-4400   °[facility] ° [address], [place] (336) 992-4800   °[facility]/Dr. Osei-Bonsu ° [address] [place] [address], [place] or [address], [place] (336) 841-8500 Phone number for both [place] and [place] locations is the same.  °[facility] [address], [place] (336) 299-0000   °[facility] [address] [place] [address], [place] or [address] (336) 852-7530 °(336) 878-2260   °  [facility] [address], [place] (336) 350-1642, phone; (336) 294-5005, fax Sees patients 1st and 3rd Saturday of every month.  Must not qualify for public or private insurance (i.e. Medicaid, Medicare, [place] Health Choice, Veterans' Benefits) • Household income should be no more than 200% of the poverty level •The clinic cannot treat you if you are pregnant or think you are pregnant • Sexually transmitted diseases are not treated at the clinic.  ° ° °Dental Care: °Organization         Address  Phone  Notes  °[facility] [facility] [address], [place] (336) 641-6152 Accepts children up to age 21 who are enrolled in Medicaid or [place] Health Choice; pregnant women with a Medicaid card; and children who have applied for Medicaid or [place] Health Choice, but were declined, whose parents can pay a reduced fee at time of service.  °[facility] [place]  [address], [place] (336) 641-7733 Accepts children up to age 21 who are  enrolled in Medicaid or [place] Health Choice; pregnant women with a Medicaid card; and children who have applied for Medicaid or [place] Health Choice, but were declined, whose parents can pay a reduced fee at time of service.  °[facility] ° [address], [place] (336) 641-4533 Patients are seen by appointment only. Walk-ins are not accepted. [facility] will see patients 18 years of age and older. °Monday - Tuesday (8am-5pm) °Most Wednesdays (8:30-5pm) °$30 per visit, cash only  °[facility] ° [address], [place] (336) 641-4533 Patients are seen by appointment only. Walk-ins are not accepted. [facility] will see patients 18 years of age and older. °One Wednesday Evening (Monthly: Volunteer Based).  $30 per visit, cash only  °[facility]  (919) 537-3737 for adults; Children under age 4, call Graduate Pediatric Dentistry at ([phone]) [phone]. Children aged 4-14, please call ([phone]) [phone] to request a pediatric application. ° Dental services are provided in all areas of dental care including fillings, crowns and bridges, complete and partial dentures, implants, gum treatment, root canals, and extractions. Preventive care is also provided. Treatment is provided to both adults and children. °Patients are selected via a lottery and there is often a waiting list. °  °[facility] [address], °[place] ° (336) 763-8833 [email or web address] °  °[facility] [address], [place], [place] (336)723-1848, Ext. 123 Second and Fourth Thursday of each month, opens at 6:30 AM; Clinic ends at 9 AM.  Patients are seen on a first-come first-served basis, and a limited number are seen during each clinic.  ° °[facility] ° [address], [place], [place] (336) 723-7904   Eligibility Requirements °You must have lived in [place], [place], or [place] counties for at least the last three months. °  You  cannot be eligible for state or federal sponsored healthcare insurance, including Veterans Administration, Medicaid, or Medicare. °  You generally cannot be eligible for healthcare insurance through your employer.  °  How to apply: °Eligibility screenings are held every Tuesday and Wednesday afternoon from 1:00 pm until 4:00 pm. You do not need an appointment for the interview!  °[facility] [address], [place], [place] 336-631-2330   °[facility]  336-342-8273   °[facility]  336-703-3100   °[facility]  336-570-6415   ° °Behavioral Health Resources in the Community: °Intensive Outpatient Programs °Organization         Address  Phone  Notes  °[facility] [address], [place], [place] 336-878-6098   °[facility] [address], [place], [place] 336-832-9800   °[facility] [address], [place], [place] ° 336-882-2125   °[facility] [address],  °[place], [place] 1-800-853-5163 or 336-641-4981   °Substance Abuse Resources °Organization         Address  Phone  Notes  °[facility]  336-882-2125   °[facility]  336-784-9470   °[facility]  336-285-9073   °[facility]  336-845-3988   °Residential & Outpatient Substance Abuse Program  1-800-659-3381   °Psychological Services °Organization         Address  Phone  Notes  °[facility]  336- 832-9600   °[facility]  336- 378-7881   °[facility] [address], [place] 1-800-853-5163 or 336-641-4981   ° °Mobile Crisis Teams °Organization         Address  Phone  Notes  °[facility], Mobile Crisis Care Unit  1-877-626-1772   °Assertive °[facility] ° [address] [place], [place] 336-834-9664   °Sharon DeEsch [address] °[place] 336-554-5454   ° °Self-Help/Support  Groups °Organization         Address  Phone             Notes  °[facility] [place] - variety of support groups  336- 373-1402 Call for more information  °Narcotics Anonymous (NA), [facility] [address], °[place]  2 meetings at this location  ° °Residential Treatment Programs °Organization         Address  Phone  Notes  °[facility] [address],    °[place]  1-866-801-8205   °[facility] ° [address], [place], [place] 704-293-8524   °[facility] [address], [place] 336-845-3988 Admissions: 8am-3pm M-F  °[facility] [address]    °[place], [place] 336-841-1104   °[facility] [address], [place], [place] 336-379-7146   °[facility] [address]  °[place], [place] 336-285-9073   °[facility] - Intensive Outpatient [address], [place], [place] 336-852-3033   °[facility] [address]  °[place], [place] 1-877-615-2722 or 336-784-9470   °[facility] [address] [place], [place] 336-227-7417 Accepts Medicaid  °[facility] [address]  °[place] 1-800-659-3381 Substance Abuse/Addiction Treatment  ° °[place] Behavioral Health Resources °Organization         Address  Phone  Notes  °[facility]  (888) 581-9988   °Julie Brannon, PhD [address] [place], [place]   (336) 349-5553 or (336) 951-0000   °[facility]   [address] °[place],  (336) 349-4454   °[facility] [address], [place],  (336) 342-8316 Insurance/Medicaid/sponsorship through [facility]  °[facility] [address]                                    [place],  (336) 342-8316 Therapy/tele-psych/case  °[facility]   [address]  ° [place], [place] (336) 349-2233    °Dr. Arfeen  (336) 349-4544   °[facility] [place]  [employer] [place] [facility] 1) [address], [place] °2) [address], [place] °3)  [address], [place] (336) 349-3220 °(336) 342-7768 ° °(336) 342-8140   °[place] Child Abuse [facility] (336) 342-1394 or (336) 342-3537 (After Hours)    ° ° ° °

## 2014-11-11 NOTE — ED Notes (Signed)
Pt reports that he has been having dental and spitting up blood for the past 3 weeks. Pt denies any abd pain or n/v at this time.

## 2014-11-11 NOTE — ED Provider Notes (Signed)
CSN: [id number]     Arrival date & time 11/11/14  1323 History   First MD Initiated Contact with Patient 11/11/14 1549     Chief Complaint  Patient presents with  . Dental Pain     (Consider location/radiation/quality/duration/timing/severity/associated sxs/prior Treatment) Patient is a 19 y.o. male presenting with tooth pain. The history is provided by the patient.  Dental Pain Location:  Upper Upper teeth location:  1/RU 3rd molar Quality:  Aching Severity:  Moderate Onset quality:  Gradual Timing:  Constant Progression:  Unchanged Chronicity:  New Relieved by:  Nothing Worsened by:  Nothing tried Associated symptoms: no fever     Past Medical History  Diagnosis Date  . Seasonal allergies    History reviewed. No pertinent past surgical history. History reviewed. No pertinent family history. History  Substance Use Topics  . Smoking status: Current Some Day Smoker    Types: Cigarettes  . Smokeless tobacco: Not on file  . Alcohol Use: No    Review of Systems  Constitutional: Negative for fever and chills.  Respiratory: Negative for cough and shortness of breath.   All other systems reviewed and are negative.     Allergies  Review of patient's allergies indicates no known allergies.  Home Medications   Prior to Admission medications   Medication Sig Start Date End Date Taking? Authorizing Provider  HYDROcodone-acetaminophen (NORCO/VICODIN) 5-325 MG per tablet Take 1 tablet by mouth every 6 (six) hours as needed for moderate pain. 11/11/14   Elwin MochaBlair Leelynd Maldonado, MD  penicillin v potassium (VEETID) 500 MG tablet Take 1 tablet (500 mg total) by mouth 4 (four) times daily. 11/11/14 11/18/14  Elwin MochaBlair Ayme Short, MD   BP 138/78 mmHg  Pulse 88  Temp(Src) 98.3 F (36.8 C) (Oral)  Resp 16  Ht 5\' 11"  (1.803 m)  Wt 140 lb (63.504 kg)  BMI 19.53 kg/m2  SpO2 100% Physical Exam  Constitutional: He is oriented to person, place, and time. He appears well-developed and well-nourished.  No distress.  HENT:  Head: Normocephalic and atraumatic.  Mouth/Throat: No oropharyngeal exudate.  Eyes: EOM are normal. Pupils are equal, round, and reactive to light.  Neck: Normal range of motion. Neck supple.  Cardiovascular: Normal rate and regular rhythm.  Exam reveals no friction rub.   No murmur heard. Pulmonary/Chest: Effort normal and breath sounds normal. No respiratory distress. He has no wheezes. He has no rales.  Abdominal: He exhibits no distension. There is no tenderness. There is no rebound.  Musculoskeletal: Normal range of motion. He exhibits no edema.  Neurological: He is alert and oriented to person, place, and time.  Skin: He is not diaphoretic.    ED Course  Procedures (including critical care time) Labs Review Labs Reviewed - No data to display  Imaging Review No results found.   EKG Interpretation None      MDM   Final diagnoses:  Tooth fracture, closed, initial encounter  Pain, dental    26M here with R posterior upper dental pain. No fracture. Began today. No facial swelling, difficulty breathing.  Dental fracture present. No intraoral abscess. Given pain meds, antibiotics, dental referral.    Elwin MochaBlair Amatullah Christy, MD 11/11/14 1650

## 2014-11-11 NOTE — ED Notes (Signed)
[facility] street. Bleeding gums on right side. NAD. 130/80. 80 regular. GCS 15

## 2015-02-06 ENCOUNTER — Encounter (HOSPITAL_COMMUNITY): Payer: Self-pay

## 2015-02-06 ENCOUNTER — Emergency Department (HOSPITAL_COMMUNITY)
Admission: EM | Admit: 2015-02-06 | Discharge: 2015-02-06 | Disposition: A | Discharge status: Home / Self Care | Payer: Medicaid Other | Attending: Emergency Medicine | Admitting: Emergency Medicine

## 2015-02-06 DIAGNOSIS — Z720 Tobacco use: Secondary | ICD-10-CM | POA: Insufficient documentation

## 2015-02-06 DIAGNOSIS — K088 Other specified disorders of teeth and supporting structures: Secondary | ICD-10-CM | POA: Diagnosis present

## 2015-02-06 DIAGNOSIS — K0889 Other specified disorders of teeth and supporting structures: Secondary | ICD-10-CM

## 2015-02-06 MED ORDER — NAPROXEN 500 MG PO TABS: 500.0000 mg | ORAL_TABLET | Freq: Two times a day (BID) | ORAL | Status: DC

## 2015-02-06 MED ORDER — PENICILLIN V POTASSIUM 500 MG PO TABS: 500.0000 mg | ORAL_TABLET | Freq: Four times a day (QID) | ORAL | Status: DC

## 2015-02-06 NOTE — ED Notes (Signed)
Pt here for dental pain to left upper jaw line. Started swelling a couple days ago and hurts to eat.

## 2015-02-06 NOTE — Discharge Instructions (Signed)

## 2015-02-06 NOTE — ED Notes (Signed)
STATES AMBULATED TO ED. STATES BROTHER IS W/HIM.

## 2015-02-06 NOTE — ED Provider Notes (Signed)
CSN: [id number]     Arrival date & time 02/06/15  1150 History  This chart was scribed for non-physician practitioner, ,Everlene Farrier, PA-C,  working with Raeford Razor, MD, by Lionel December, ED Scribe. This patient was seen in room TR06C/TR06C and the patient's care was started at 12:08 PM.    Chief Complaint  Patient presents with  . Dental Pain     (Consider location/radiation/quality/duration/timing/severity/associated sxs/prior Treatment) Patient is a 19 y.o. male presenting with tooth pain. The history is provided by the patient. No language interpreter was used.  Dental Pain Associated symptoms: no drooling, no fever, no headaches and no oral lesions     HPI Comments: Raymond Moore is a 19 y.o. male who presents to the Emergency Department complaining of dental pain on his left lower tooth onset three days ago.  Patient has associated symptoms of painful eating.   Patient denies any discharge, fever/chills, sore throat, difficulty swallowing, mouth sores, ear pain, eye pain, headaches, lightheadedness/diziness.  Patient is unsure if he has a Education officer, community and does not have insurance.  Patient has not taken any medication to alleviate his symptoms.  Patient has no other complaints today.    Past Medical History  Diagnosis Date  . Seasonal allergies    History reviewed. No pertinent past surgical history. No family history on file. History  Substance Use Topics  . Smoking status: Current Some Day Smoker    Types: Cigarettes  . Smokeless tobacco: Not on file  . Alcohol Use: No    Review of Systems  Constitutional: Negative for fever and chills.  HENT: Positive for dental problem. Negative for drooling, ear pain, mouth sores, sore throat and trouble swallowing.   Eyes: Negative for pain.  Gastrointestinal: Negative for abdominal pain.  Skin: Negative for rash.  Neurological: Negative for light-headedness and headaches.      Allergies  Review of patient's allergies indicates  no known allergies.  Home Medications   Prior to Admission medications   Medication Sig Start Date End Date Taking? Authorizing Provider  HYDROcodone-acetaminophen (NORCO/VICODIN) 5-325 MG per tablet Take 1 tablet by mouth every 6 (six) hours as needed for moderate pain. 11/11/14   Elwin Mocha, MD  naproxen (NAPROSYN) 500 MG tablet Take 1 tablet (500 mg total) by mouth 2 (two) times daily with a meal. 02/06/15   Everlene Farrier, PA-C  penicillin v potassium (VEETID) 500 MG tablet Take 1 tablet (500 mg total) by mouth 4 (four) times daily. 02/06/15   Everlene Farrier, PA-C   BP 114/67 mmHg  Pulse 78  Temp(Src) 98.1 F (36.7 C) (Oral)  Resp 18  Ht  (1.88 m)  Wt 145 lb (65.772 kg)  BMI 18.61 kg/m2  SpO2 97% Physical Exam  Constitutional: He is oriented to person, place, and time. He appears well-developed and well-nourished. No distress.  Nontoxic appearing.  HENT:  Head: Normocephalic and atraumatic.  Right Ear: External ear normal.  Left Ear: External ear normal.  Nose: Nose normal.  Mouth/Throat: Uvula is midline and oropharynx is clear and moist. No uvula swelling. No oropharyngeal exudate.  Left wisdom tooth appears to be causing him pain.  No abscess,drainage, erythema. The uvula is midline without edema. Soft palate rises symmetrically. no obvious cracked tooth.  No indruation. No facial swelling.  Bilateral tympanic membranes are pearly-gray without erythema or loss of landmarks.  Eyes: Conjunctivae and EOM are normal. Pupils are equal, round, and reactive to light. Right eye exhibits no discharge. Left eye exhibits no discharge.  Neck: Neck supple.  Cardiovascular: Normal rate, regular rhythm and normal heart sounds.  Exam reveals no friction rub.   No murmur heard. Pulmonary/Chest: Effort normal and breath sounds normal. No respiratory distress. He has no wheezes. He has no rales.  Lymphadenopathy:    He has no cervical adenopathy.  Neurological: He is alert and  oriented to person, place, and time. No cranial nerve deficit. Coordination normal.  Skin: Skin is warm and dry. No rash noted. He is not diaphoretic. No erythema. No pallor.  Psychiatric: He has a normal mood and affect. His behavior is normal.  Nursing note and vitals reviewed.   ED Course  Procedures (including critical care time) DIAGNOSTIC STUDIES: Oxygen Saturation is 97% on RA, normal by my interpretation.    COORDINATION OF CARE: 12:15 PM Discussed treatment plan with patient at beside, the patient agrees with the plan and has no further questions at this time.   Labs Review Labs Reviewed - No data to display  Imaging Review No results found.   EKG Interpretation None      Filed Vitals:   02/06/15 1156  BP: 114/67  Pulse: 78  Temp: 98.1 F (36.7 C)  TempSrc: Oral  Resp: 18  Height: 6\' 2"  (1.88 m)  Weight: 145 lb (65.772 kg)  SpO2: 97%     MDM   Meds given in ED:  Medications - No data to display  New Prescriptions   NAPROXEN (NAPROSYN) 500 MG TABLET    Take 1 tablet (500 mg total) by mouth 2 (two) times daily with a meal.   PENICILLIN V POTASSIUM (VEETID) 500 MG TABLET    Take 1 tablet (500 mg total) by mouth 4 (four) times daily.    Final diagnoses:  Pain, dental   Patient with toothache.  No gross abscess.  Exam unconcerning for Ludwig's angina or spread of infection.  Will treat with penicillin and pain medicine.  Urged patient to follow-up with dentist. I advised the patient to follow-up with their primary care provider this week. I advised the patient to return to the emergency department with new or worsening symptoms or new concerns. The patient verbalized understanding and agreement with plan.   I personally performed the services described in this documentation, which was scribed in my presence. The recorded information has been reviewed and is accurate.     Everlene FarrierWilliam Kriston Pasquarello, PA-C 02/06/15 1222  Raeford RazorStephen Kohut, MD 02/07/15 [phone]

## 2015-05-14 IMAGING — CR DG CHEST 2V
2 series · 2 of 2 positions shown · non-contrast
Comparison: None.

CLINICAL DATA: Cough and congestion, history of tobacco use

EXAM:
CHEST  2 VIEW

[w chest pa]
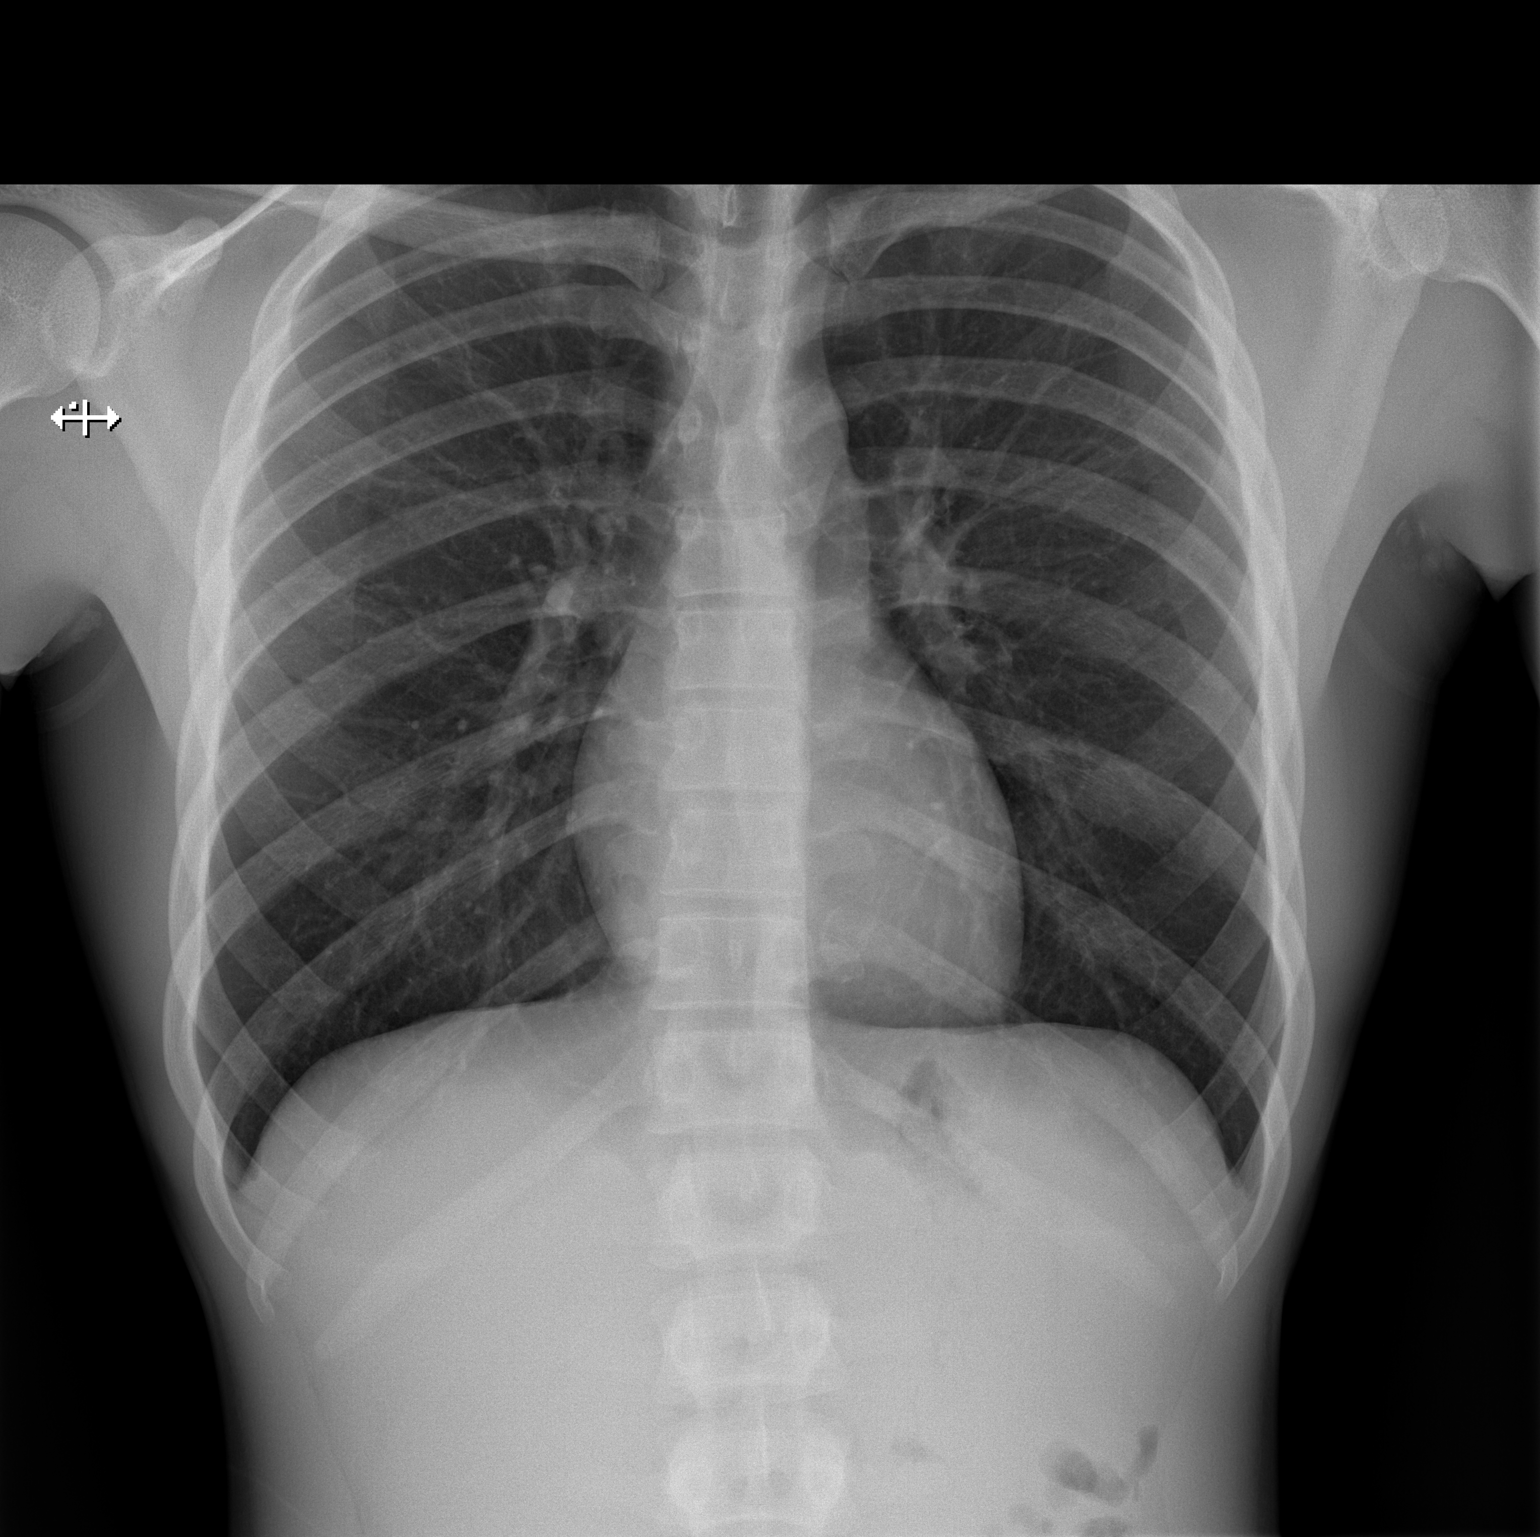

[w chest lat]
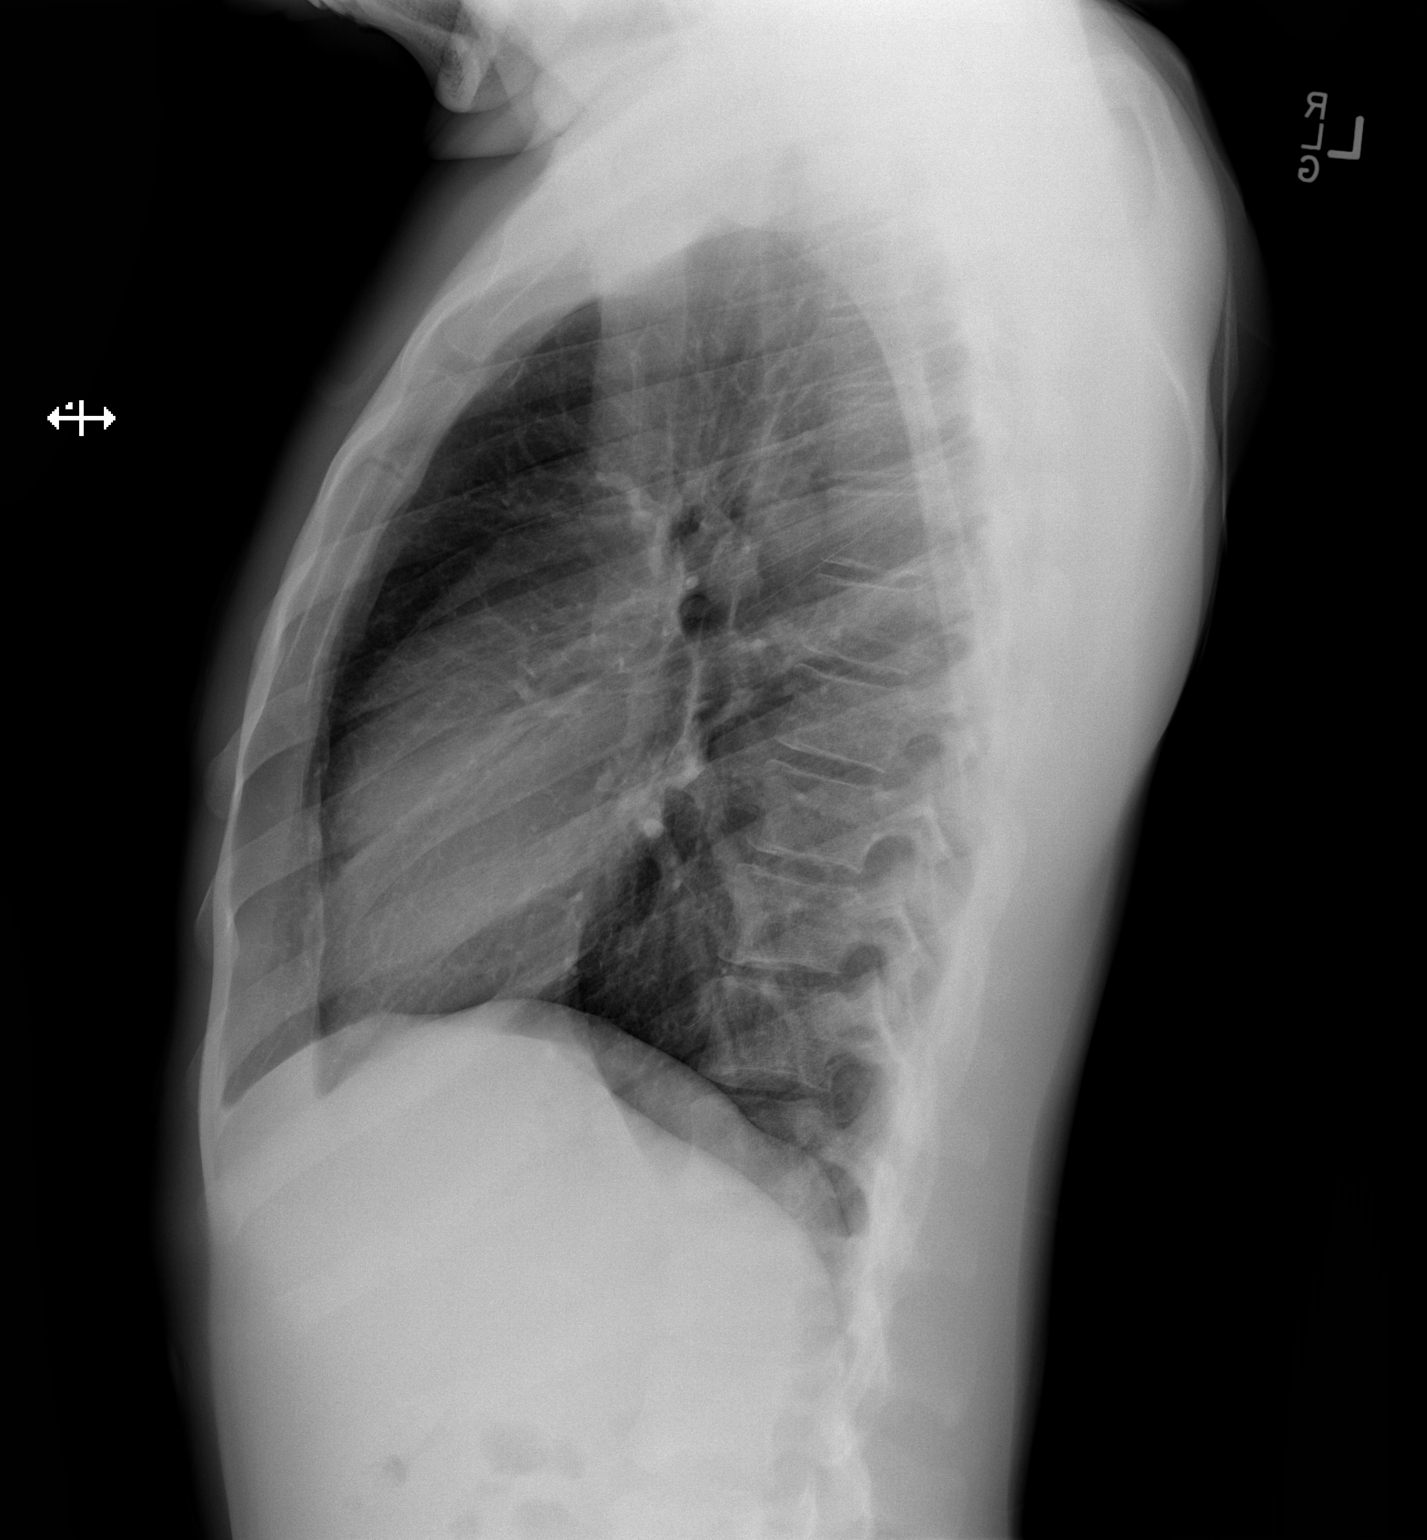

[2 of 2 positions shown; findings below may reference images not displayed]

FINDINGS: The heart size and mediastinal contours are within normal limits.
Both lungs are clear. The visualized skeletal structures are
unremarkable.
IMPRESSION: No active cardiopulmonary disease.

## 2016-01-18 ENCOUNTER — Emergency Department (HOSPITAL_COMMUNITY)
Admission: EM | Admit: 2016-01-18 | Discharge: 2016-01-18 | Disposition: A | Discharge status: Home / Self Care | Payer: Medicaid Other | Attending: Emergency Medicine | Admitting: Emergency Medicine

## 2016-01-18 ENCOUNTER — Encounter (HOSPITAL_COMMUNITY): Payer: Self-pay | Admitting: Emergency Medicine

## 2016-01-18 DIAGNOSIS — F17210 Nicotine dependence, cigarettes, uncomplicated: Secondary | ICD-10-CM | POA: Diagnosis not present

## 2016-01-18 DIAGNOSIS — K0381 Cracked tooth: Secondary | ICD-10-CM | POA: Diagnosis not present

## 2016-01-18 DIAGNOSIS — K0889 Other specified disorders of teeth and supporting structures: Secondary | ICD-10-CM | POA: Insufficient documentation

## 2016-01-18 NOTE — ED Notes (Addendum)
Per EMS pt c/o dental pain x several months, has cracked tooth, had received penicillin for it, pain worsened significantly 2 days ago.

## 2016-01-18 NOTE — ED Notes (Signed)
Pt registration they were leaving

## 2016-01-21 ENCOUNTER — Emergency Department (HOSPITAL_COMMUNITY)
Admission: EM | Admit: 2016-01-21 | Discharge: 2016-01-21 | Disposition: A | Discharge status: Home / Self Care | Payer: Medicaid Other | Attending: Emergency Medicine | Admitting: Emergency Medicine

## 2016-01-21 DIAGNOSIS — K0381 Cracked tooth: Secondary | ICD-10-CM | POA: Insufficient documentation

## 2016-01-21 DIAGNOSIS — H9209 Otalgia, unspecified ear: Secondary | ICD-10-CM | POA: Insufficient documentation

## 2016-01-21 DIAGNOSIS — F17210 Nicotine dependence, cigarettes, uncomplicated: Secondary | ICD-10-CM | POA: Insufficient documentation

## 2016-01-21 DIAGNOSIS — K0889 Other specified disorders of teeth and supporting structures: Secondary | ICD-10-CM | POA: Diagnosis not present

## 2016-01-21 MED ORDER — ACETAMINOPHEN 325 MG PO TABS
650.0000 mg | ORAL_TABLET | Freq: Once | ORAL | Status: AC
  Administered 2016-01-21: 650 mg via ORAL
  Filled 2016-01-21: qty 2

## 2016-01-21 MED ORDER — NAPROXEN 500 MG PO TABS: 500.0000 mg | ORAL_TABLET | Freq: Two times a day (BID) | ORAL | Status: AC

## 2016-01-21 MED ORDER — PENICILLIN V POTASSIUM 500 MG PO TABS: 500.0000 mg | ORAL_TABLET | Freq: Four times a day (QID) | ORAL | Status: AC

## 2016-01-21 NOTE — Discharge Instructions (Signed)
Dental Abscess °A dental abscess is a collection of pus in or around a tooth. °CAUSES °This condition is caused by a bacterial infection around the root of the tooth that involves the inner part of the tooth (pulp). It may result from: °· Severe tooth decay. °· Trauma to the tooth that allows bacteria to enter into the pulp, such as a broken or chipped tooth. °· Severe gum disease around a tooth. °SYMPTOMS °Symptoms of this condition include: °· Severe pain in and around the infected tooth. °· Swelling and redness around the infected tooth, in the mouth, or in the face. °· Tenderness. °· Pus drainage. °· Bad breath. °· Bitter taste in the mouth. °· Difficulty swallowing. °· Difficulty opening the mouth. °· Nausea. °· Vomiting. °· Chills. °· Swollen neck glands. °· Fever. °DIAGNOSIS °This condition is diagnosed with examination of the infected tooth. During the exam, your dentist may tap on the infected tooth. Your dentist will also ask about your medical and dental history and may order X-rays. °TREATMENT °This condition is treated by eliminating the infection. This may be done with: °· Antibiotic medicine. °· A root canal. This may be performed to save the tooth. °· Pulling (extracting) the tooth. This may also involve draining the abscess. This is done if the tooth cannot be saved. °HOME CARE INSTRUCTIONS °· Take medicines only as directed by your dentist. °· If you were prescribed antibiotic medicine, finish all of it even if you start to feel better. °· Rinse your mouth (gargle) often with salt water to relieve pain or swelling. °· Do not drive or operate heavy machinery while taking pain medicine. °· Do not apply heat to the outside of your mouth. °· Keep all follow-up visits as directed by your dentist. This is important. °SEEK MEDICAL CARE IF: °· Your pain is worse and is not helped by medicine. °SEEK IMMEDIATE MEDICAL CARE IF: °· You have a fever or chills. °· Your symptoms suddenly get worse. °· You have a  very bad headache. °· You have problems breathing or swallowing. °· You have trouble opening your mouth. °· You have swelling in your neck or around your eye. °  °This information is not intended to replace advice given to you by your health care provider. Make sure you discuss any questions you have with your health care provider. °  °Document Released: 10/13/2005 Document Revised: 02/27/2015 Document Reviewed: 10/10/2014 °Elsevier Interactive Patient Education ©2016 Elsevier Inc. °Community Resource Guide Dental °The [employer] Way’s “211” is a great source of information about community services available.  Access by dialing 2-1-1 from anywhere in [place], or by website -  www.nc211.org.  ° °Other Local Resources (Updated 10/2015) ° °Dental  Care °  °Services ° °  °Phone Number and Address  °Cost  °[facility] For children 0 - 21 years of age:  °• Cleaning °• Tooth brushing/flossing instruction °• Sealants, fillings, crowns °• Extractions °• Emergency treatment  [phone] °[address] °[place], [place] [address] Charges based on family income.  Medicaid and some insurance plans accepted.   °  °[facility] [place] • Cleaning °• Sealants, fillings, crowns °• Extractions °• Emergency treatment [phone] °[address] °[place], [place] ° Pregnant women 18 years of age or older with a Medicaid card  °[facility] [place] • Cleaning °• Sealants, fillings, crowns °• Extractions °• Emergency treatment [phone] °[address] °[place], [place] Pregnant   women 18 years of age or older with a Medicaid card  °[facility] For children 0 - 21 years of age:  °• Cleaning °• Tooth brushing/flossing instruction °• Sealants, fillings, crowns °• Extractions °• Emergency treatment °Limited orthodontic services for patients with Medicaid [phone] °[address] °[place], [place] [address] Medicaid and [place] Health Choice cover for children up to age 21 and pregnant women.  Parents of children up to age 21 without Medicaid pay a reduced fee at time of service.  °[facility] [place] For children 0 - 21 years of age:  °• Cleaning °• Tooth brushing/flossing instruction °• Sealants, fillings, crowns °• Extractions °• Emergency treatment °Limited orthodontic services for patients with Medicaid [phone] °[address] °[place], [place].  Medicaid and [place] Health Choice cover for children up to age 21 and pregnant women.  Parents of children up to age 21 without Medicaid pay a reduced fee.  °[facility] • Cleaning °• Sealants, fillings, crowns °• Extractions ° °Hours: Tuesdays and Thursdays, 4:15 - 8 pm [phone] °[address] °[place], [place] [address] Services free of charge to [place] residents ages 18-64 who do not have health insurance, Medicare, Medicaid, or VA benefits and fall within federal poverty guidelines  °[facility] ° ° ° Provides dental care in addition to primary medical care, nutritional counseling, and pharmacy: °• Cleaning °• Sealants, fillings, crowns °• Extractions ° ° ° ° ° ° ° ° ° ° ° ° ° ° ° ° ° [phone] °[facility], [address] °[place], [place] ° °[phone] °[facility], [address] [place], [place] ° °[phone] °[facility] °[place], [place] ° °[phone] °[facility], [address] °, [place] ° °[phone] °[facility] °[address] °[place], [place] Accepts Medicaid, Medicare, most insurance.  Also provides services available to all with fees adjusted based on ability to pay.    °[facility] • Cleaning °• Tooth brushing/flossing  instruction °• Sealants, fillings, crowns °• Extractions °• Emergency treatment °Hours: Tuesdays, Thursdays, and Fridays from 8 am to 5 pm by appointment only. [phone] °[address] °[place], [place] [address] [place] residents with Medicaid (depending on eligibility) and children with [place] Health Choice - call for more information.  °[facility] • Extractions only ° °Hours: 2nd and 4th Thursday of each month from 6:30 am - 9 am.   [phone] ext. [phone] °[address] °[place], [place] [address] Ages 18 and older only.  Patients are seen on a first come, first served basis.  °[facility] • Cleanings °• Fillings °• Extractions °• Orthodontics °• Endodontics °• Implants/Crowns/Bridges °• Complete and partial dentures [phone] °[place],  Patients must complete an application for services.  There is often a waiting list.   ° °

## 2016-01-21 NOTE — ED Notes (Signed)
Declined W/C at D/C and was escorted to lobby by RN. 

## 2016-01-21 NOTE — ED Provider Notes (Signed)
CSN: [id number]     Arrival date & time 01/21/16  0903 History  By signing my name below, I, Tanda RockersMargaux Venter, attest that this documentation has been prepared under the direction and in the presence of Everlene FarrierWilliam Lenix Benoist, PA-C. Electronically Signed: Tanda RockersMargaux Venter, ED Scribe. 01/21/2016. 9:54 AM.   No chief complaint on file.  The history is provided by the patient. No language interpreter was used.     HPI Comments: Raymond MustacheMaurice Moore is a 20 y.o. male who presents to the Emergency Department complaining of gradual onset, constant, 10/10, left lower dental pain x several months, worsening over the past 2 days. Pt also complains of left ear pain. He has not taken any pain medication today. He is able to tolerate his secretions well. Denies discharge, neck pain, neck stiffness, or any other associated symptoms.    Past Medical History  Diagnosis Date  . Seasonal allergies    No past surgical history on file. No family history on file. Social History  Substance Use Topics  . Smoking status: Current Some Day Smoker    Types: Cigarettes  . Smokeless tobacco: Not on file  . Alcohol Use: No    Review of Systems  Constitutional: Negative for fever.  HENT: Positive for dental problem and ear pain. Negative for drooling, ear discharge, facial swelling, sore throat and trouble swallowing.   Musculoskeletal: Negative for neck pain and neck stiffness.  Skin: Negative for rash.   Allergies  Review of patient's allergies indicates no known allergies.  Home Medications   Prior to Admission medications   Medication Sig Start Date End Date Taking? Authorizing Provider  HYDROcodone-acetaminophen (NORCO/VICODIN) 5-325 MG per tablet Take 1 tablet by mouth every 6 (six) hours as needed for moderate pain. 11/11/14   Elwin MochaBlair Walden, MD  naproxen (NAPROSYN) 500 MG tablet Take 1 tablet (500 mg total) by mouth 2 (two) times daily with a meal. 01/21/16   Everlene FarrierWilliam Reve Crocket, PA-C  penicillin v potassium (VEETID) 500 MG  tablet Take 1 tablet (500 mg total) by mouth 4 (four) times daily. 01/21/16   Everlene FarrierWilliam Macy Lingenfelter, PA-C   BP 132/85 mmHg  Pulse 59  Temp(Src) 97.7 F (36.5 C)  Resp 16  Ht 5\' 11"  (1.803 m)  Wt 63.504 kg  BMI 19.53 kg/m2  SpO2 100%   Physical Exam  Constitutional: He appears well-developed and well-nourished. No distress.  Non-toxic appearing.   HENT:  Head: Normocephalic and atraumatic.  Right Ear: External ear normal.  Left Ear: External ear normal.  Mouth/Throat: Oropharynx is clear and moist. No oropharyngeal exudate.  Tenderness to left lower molar. Cracked left lower molar. No abscess. No induration.  No discharge from the mouth. No facial swelling.  Uvula is midline without edema. Soft palate rises symmetrically. No tonsillar hypertrophy or exudates. Tongue protrusion is normal. No trismus.   BL TMs are pearly grey without erythema or loss of landmarks.  Eyes: Conjunctivae are normal. Pupils are equal, round, and reactive to light. Right eye exhibits no discharge. Left eye exhibits no discharge.  Neck: Normal range of motion. Neck supple. No JVD present. No tracheal deviation present.  Good ROM of the neck.   Cardiovascular: Normal rate, regular rhythm, normal heart sounds and intact distal pulses.   Pulmonary/Chest: Effort normal and breath sounds normal. No respiratory distress. He has no wheezes. He has no rales.  Lymphadenopathy:    He has no cervical adenopathy.  Neurological: He is alert. No cranial nerve deficit. Coordination normal.  Skin: Skin is  warm and dry. No rash noted. He is not diaphoretic. No erythema. No pallor.  Psychiatric: He has a normal mood and affect. His behavior is normal.  Nursing note and vitals reviewed.   ED Course  Procedures (including critical care time)  DIAGNOSTIC STUDIES: Oxygen Saturation is 100% on RA, normal by my interpretation.    COORDINATION OF CARE: 9:53 AM-Discussed treatment plan which includes Rx penicillin with pt at  bedside and pt agreed to plan.   MDM   Meds given in ED:  Medications  acetaminophen (TYLENOL) tablet 650 mg (650 mg Oral Given 01/21/16 1013)    Discharge Medication List as of 01/21/2016  9:56 AM      Final diagnoses:  Pain, dental   This is a 20 y.o. male who presents to the Emergency Department complaining of gradual onset, constant, 10/10, left lower dental pain x several months, worsening over the past 2 days. Pt also complains of left ear pain. He has not taken any pain medication today. Patient with toothache.  No gross abscess.  Exam unconcerning for Ludwig's angina or spread of infection.  Will treat with penicillin and pain medicine.  Urged patient to follow-up with dentist.  Dental resource guide provided. I advised the patient to follow-up with their primary care provider this week. I advised the patient to return to the emergency department with new or worsening symptoms or new concerns. The patient verbalized understanding and agreement with plan.    I personally performed the services described in this documentation, which was scribed in my presence. The recorded information has been reviewed and is accurate.       Everlene Farrier, PA-C 01/21/16 1046  Arby Barrette, MD 01/23/16 1700
